# Patient Record
Sex: Female | Born: 2005 | Race: Black or African American | Hispanic: No | Smoking: Never smoker
Health system: Southern US, Community
[De-identification: ages and names within clinical notes are randomized; demographics above are authoritative.]

## PROBLEM LIST (undated history)

## (undated) ENCOUNTER — Inpatient Hospital Stay (HOSPITAL_COMMUNITY): Payer: Self-pay

## (undated) DIAGNOSIS — T783XXA Angioneurotic edema, initial encounter: Secondary | ICD-10-CM

## (undated) DIAGNOSIS — L509 Urticaria, unspecified: Secondary | ICD-10-CM

## (undated) HISTORY — DX: Urticaria, unspecified: L50.9

## (undated) HISTORY — DX: Angioneurotic edema, initial encounter: T78.3XXA

## (undated) HISTORY — PX: ORTHOPEDIC SURGERY: SHX850

---
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
## 2023-02-02 ENCOUNTER — Other Ambulatory Visit: Payer: Self-pay

## 2023-02-02 ENCOUNTER — Encounter (HOSPITAL_BASED_OUTPATIENT_CLINIC_OR_DEPARTMENT_OTHER): Payer: Self-pay | Admitting: Emergency Medicine

## 2023-02-02 ENCOUNTER — Emergency Department (HOSPITAL_BASED_OUTPATIENT_CLINIC_OR_DEPARTMENT_OTHER)
Admission: EM | Admit: 2023-02-02 | Discharge: 2023-02-02 | Disposition: A | Discharge status: Home / Self Care | Payer: Medicaid Other | Attending: Emergency Medicine | Admitting: Emergency Medicine

## 2023-02-02 ENCOUNTER — Emergency Department (HOSPITAL_BASED_OUTPATIENT_CLINIC_OR_DEPARTMENT_OTHER): Payer: Medicaid Other | Admitting: Radiology

## 2023-02-02 DIAGNOSIS — R079 Chest pain, unspecified: Secondary | ICD-10-CM | POA: Diagnosis present

## 2023-02-02 DIAGNOSIS — R0781 Pleurodynia: Secondary | ICD-10-CM | POA: Diagnosis not present

## 2023-02-02 LAB — BASIC METABOLIC PANEL
Anion gap: 9 (ref 5–15)
BUN: 11 mg/dL (ref 6–20)
CO2: 23 mmol/L (ref 22–32)
Calcium: 9.5 mg/dL (ref 8.9–10.3)
Chloride: 105 mmol/L (ref 98–111)
Creatinine, Ser: 0.84 mg/dL (ref 0.44–1.00)
GFR, Estimated: >60 mL/min (ref >60)
Glucose, Bld: 94 mg/dL (ref 70–99)
Potassium: 4 mmol/L (ref 3.5–5.1)
Sodium: 137 mmol/L (ref 135–145)

## 2023-02-02 LAB — CBC
HCT: 36.8 % (ref 36.0–46.0)
Hemoglobin: 12.3 g/dL (ref 12.0–15.0)
MCH: 30.8 pg (ref 26.0–34.0)
MCHC: 33.4 g/dL (ref 30.0–36.0)
MCV: 92.2 fL (ref 80.0–100.0)
Platelets: 365 10*3/uL (ref 150–400)
RBC: 3.99 MIL/uL (ref 3.87–5.11)
RDW: 13.6 % (ref 11.5–15.5)
WBC: 5 10*3/uL (ref 4.0–10.5)
nRBC: 0 % (ref 0.0–0.2)

## 2023-02-02 LAB — PREGNANCY, URINE: Preg Test, Ur: NEGATIVE

## 2023-02-02 LAB — D-DIMER, QUANTITATIVE: D-Dimer, Quant: <0.27 ug{FEU}/mL (ref 0.00–0.50)

## 2023-02-02 LAB — TROPONIN I (HIGH SENSITIVITY): Troponin I (High Sensitivity): 2 ng/L (ref <18)

## 2023-02-02 MED ORDER — LIDOCAINE 5 % EX PTCH
1.0000 | MEDICATED_PATCH | CUTANEOUS | Status: DC
  Administered 2023-02-02: 1 via TRANSDERMAL
  Filled 2023-02-02: qty 1

## 2023-02-02 MED ORDER — AZITHROMYCIN 250 MG PO TABS: 250.0000 mg | ORAL_TABLET | Freq: Every day | ORAL | 0 refills | Status: DC

## 2023-02-02 MED ORDER — ALBUTEROL SULFATE HFA 108 (90 BASE) MCG/ACT IN AERS
2.0000 | INHALATION_SPRAY | Freq: Once | RESPIRATORY_TRACT | Status: DC
  Filled 2023-02-02: qty 6.7

## 2023-02-02 NOTE — ED Provider Notes (Signed)
[place] EMERGENCY DEPARTMENT AT [facility] Provider Note   CSN: [id number] Arrival date & time: 02/02/23  1549     History  Chief Complaint  Patient presents with   Chest Pain    Natasha Hanna is a 17 y.o. female, no pertinent past medical history, on OCPs, who presents to the ED secondary to right pleuritic chest pain, it has been going on for the last couple months.  She states that in August, she went to a outside hospital, and was diagnosed with costochondritis, she was given ibuprofen and Toradol, with improvement of her symptoms for couple weeks.  Since then she has had intermittent right-sided chest pain, that got worse last night, that is pressurized in nature, worse when taking a deep breath.  She denies any swelling of her limbs, denies any kind of rashes.  States the area is just very tender especially when she touches it, or takes a deep breath.  She states her pain is a 7 out of 10.    Home Medications Prior to Admission medications   Medication Sig Start Date End Date Taking? Authorizing Provider  azithromycin (ZITHROMAX) 250 MG tablet Take 1 tablet (250 mg total) by mouth daily. Take first 2 tablets together, then 1 every day until finished. 02/02/23  Yes Micky Sheller L, PA  ibuprofen (ADVIL) 600 MG tablet Take 1 tablet (600 mg total) by mouth every 8 (eight) hours as needed for moderate pain. 08/29/19   Terrilee Files, MD      Allergies    Patient has no known allergies.    Review of Systems   Review of Systems  Respiratory:  Negative for shortness of breath.   Cardiovascular:  Positive for chest pain. Negative for leg swelling.    Physical Exam Updated Vital Signs BP (!) 143/96 (BP Location: Right Arm)   Pulse (!) 110   Temp 98.3 F (36.8 C) (Oral)   Resp 17   Ht 5' 7.5" (1.715 m)   Wt 67.6 kg   LMP 01/19/2023 (Exact Date)   SpO2 99%   BMI 22.99 kg/m  Physical Exam Vitals and nursing note reviewed.  Constitutional:      General:  She is not in acute distress.    Appearance: She is well-developed.  HENT:     Head: Normocephalic and atraumatic.  Eyes:     Conjunctiva/sclera: Conjunctivae normal.  Cardiovascular:     Rate and Rhythm: Normal rate and regular rhythm.     Heart sounds: No murmur heard. Pulmonary:     Effort: Pulmonary effort is normal. No respiratory distress.     Breath sounds: Normal breath sounds.  Chest:     Chest wall: Tenderness present.     Comments: Tenderness to palpation of right anterior chest wall, without crepitus, edema, warmth Abdominal:     Palpations: Abdomen is soft.     Tenderness: There is no abdominal tenderness.  Musculoskeletal:        General: No swelling.     Cervical back: Neck supple.  Skin:    General: Skin is warm and dry.     Capillary Refill: Capillary refill takes less than 2 seconds.  Neurological:     Mental Status: She is alert.  Psychiatric:        Mood and Affect: Mood normal.     ED Results / Procedures / Treatments   Labs (all labs ordered are listed, but only abnormal results are displayed) Labs Reviewed  BASIC METABOLIC PANEL  CBC  PREGNANCY, URINE  D-DIMER, QUANTITATIVE  TROPONIN I (HIGH SENSITIVITY)    EKG None  Radiology DG Chest 2 View  Result Date: 02/02/2023 CLINICAL DATA:  Chest pain EXAM: CHEST - 2 VIEW COMPARISON:  03/29/2011 FINDINGS: Midline trachea. Normal heart size and mediastinal contours. No pleural effusion or pneumothorax. Lower lung predominant interstitial thickening is mild to moderate. No lobar consolidation. EKG lead artifacts project over the upper lobes on the frontal. IMPRESSION: Lower lung predominant interstitial thickening is nonspecific. In this nonsmoker, considerations include asthma or viral/mycoplasma pneumonia. Electronically Signed   By: Jeronimo Greaves M.D.   On: 02/02/2023 18:38    Procedures Procedures    Medications Ordered in ED Medications  lidocaine (LIDODERM) 5 % 1 patch (1 patch Transdermal  Patch Applied 02/02/23 1709)    ED Course/ Medical Decision Making/ A&P                                 Medical Decision Making Patient is a 17 year old female, here for right-sided chest pain, and has been going on for last couple months, but has gotten worse in the past day.  She states it hurts when she takes a deep breath, and that it only went away for about 2 weeks while she was taking ibuprofen, but it came back.  She does have some chest wall tenderness, pain worse with a deep breath.  Given that she is on oral contraceptives, we will obtain a D-dimer, troponins, and chest x-ray for further evaluation.  She is overall well-appearing.  Amount and/or Complexity of Data Reviewed Labs: ordered.    Details: Troponin within normal limits, D-dimer less than 0.27 Radiology: ordered.    Details: Chest x-ray shows possible asthma findings, versus a viral or mycoplasma pneumonia Discussion of management or test interpretation with external provider(s): Given recurrent symptoms, for follow-up, we will go ahead and treat for possible mycoplasma pneumonia given the pleuritic pain, I will prescribe her some Z-Pak, have her follow-up with a PCP.  She does not have a PCP, however she was instructed to follow-up with the [facility] health center, or to use the number on the back of the discharge, to make an appointment with the primary care doctor.  She was advised on return precautions she voiced understanding.  I also emphasized the importance of following up with PCP in regards to possibly getting started on a maintenance inhaler.  Risk Prescription drug management.   Final Clinical Impression(s) / ED Diagnoses Final diagnoses:  Pleurodynia    Rx / DC Orders ED Discharge Orders          Ordered    azithromycin (ZITHROMAX) 250 MG tablet  Daily        02/02/23 1901              Pete Pelt, PA 02/02/23 1906    Benjiman Core, MD 02/02/23 2237
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
## 2023-02-02 NOTE — Discharge Instructions (Addendum)
Your chest x-ray was concerning for possible pneumonia, versus uncontrolled asthma.  We will go ahead and treat you for possible pneumonia with azithromycin, however I need you to follow-up with your primary care doctor, for better control of your asthma.  You will likely need a maintenance inhaler, as well as your rescue inhaler.  Return to the ER if you develop worsening chest pain, shortness of breath, or fevers or chills

## 2024-02-14 ENCOUNTER — Other Ambulatory Visit: Payer: Self-pay

## 2024-02-14 ENCOUNTER — Ambulatory Visit
Admission: EM | Admit: 2024-02-14 | Discharge: 2024-02-14 | Disposition: A | Discharge status: Home / Self Care | Attending: Internal Medicine | Admitting: Internal Medicine

## 2024-02-14 ENCOUNTER — Encounter (HOSPITAL_COMMUNITY): Payer: Self-pay | Admitting: Emergency Medicine

## 2024-02-14 ENCOUNTER — Emergency Department (HOSPITAL_COMMUNITY): Admission: EM | Admit: 2024-02-14 | Discharge: 2024-02-15 | Disposition: A

## 2024-02-14 ENCOUNTER — Encounter: Payer: Self-pay | Admitting: Emergency Medicine

## 2024-02-14 DIAGNOSIS — R221 Localized swelling, mass and lump, neck: Secondary | ICD-10-CM | POA: Diagnosis present

## 2024-02-14 DIAGNOSIS — R300 Dysuria: Secondary | ICD-10-CM

## 2024-02-14 DIAGNOSIS — R1024 Suprapubic pain: Secondary | ICD-10-CM

## 2024-02-14 DIAGNOSIS — T7819XA Other adverse food reactions, not elsewhere classified, initial encounter: Secondary | ICD-10-CM | POA: Insufficient documentation

## 2024-02-14 DIAGNOSIS — N3000 Acute cystitis without hematuria: Secondary | ICD-10-CM | POA: Diagnosis not present

## 2024-02-14 DIAGNOSIS — T7840XA Allergy, unspecified, initial encounter: Secondary | ICD-10-CM

## 2024-02-14 LAB — POCT URINE DIPSTICK
Bilirubin, UA: NEGATIVE
Glucose, UA: NEGATIVE mg/dL
Ketones, POC UA: NEGATIVE mg/dL
Nitrite, UA: NEGATIVE
POC PROTEIN,UA: NEGATIVE
Spec Grav, UA: 1.015 (ref 1.010–1.025)
Urobilinogen, UA: 1 U/dL
pH, UA: 7.5 (ref 5.0–8.0)

## 2024-02-14 MED ORDER — SULFAMETHOXAZOLE-TRIMETHOPRIM 800-160 MG PO TABS: 1.0000 | ORAL_TABLET | Freq: Two times a day (BID) | ORAL | 0 refills | Status: DC

## 2024-02-14 MED ORDER — CEPHALEXIN 500 MG PO CAPS: 500.0000 mg | ORAL_CAPSULE | Freq: Four times a day (QID) | ORAL | 0 refills | Status: DC

## 2024-02-14 MED ORDER — EPINEPHRINE 0.3 MG/0.3ML IJ SOAJ
INTRAMUSCULAR | Status: AC
  Administered 2024-02-14: 0.3 mg via INTRAMUSCULAR
  Filled 2024-02-14: qty 0.3

## 2024-02-14 MED ORDER — DIPHENHYDRAMINE HCL 50 MG/ML IJ SOLN
50.0000 mg | Freq: Once | INTRAMUSCULAR | Status: AC
  Administered 2024-02-14: 50 mg via INTRAVENOUS
  Filled 2024-02-14: qty 1

## 2024-02-14 MED ORDER — METHYLPREDNISOLONE SODIUM SUCC 125 MG IJ SOLR
125.0000 mg | Freq: Once | INTRAMUSCULAR | Status: AC
  Administered 2024-02-14: 125 mg via INTRAVENOUS
  Filled 2024-02-14: qty 2

## 2024-02-14 MED ORDER — FAMOTIDINE IN NACL 20-0.9 MG/50ML-% IV SOLN
20.0000 mg | Freq: Once | INTRAVENOUS | Status: AC
  Administered 2024-02-14: 20 mg via INTRAVENOUS
  Filled 2024-02-14: qty 50

## 2024-02-14 MED ORDER — EPINEPHRINE 0.3 MG/0.3ML IJ SOAJ: 0.3000 mg | Freq: Once | INTRAMUSCULAR | Status: AC

## 2024-02-14 NOTE — Discharge Instructions (Signed)
 Urinalysis done today is consistent with a urinary tract infection along with the symptoms.  Given the recent urinary tract infection we will send the urine off for culture to check for sensitivity of antibiotics.  We will use Bactrim for the antibiotic given the recent use of Macrobid.  We will treat with the following: Sulfamethoxazole-trimethoprim (Bactrim DS) 800/160 mg twice daily for 5 days.  This is an antibiotic.  Take this with food.  Make sure to stay hydrated by drinking plenty of water. Return to urgent care or PCP if symptoms worsen or fail to resolve.

## 2024-02-14 NOTE — Discharge Instructions (Addendum)
 Stop taking the Septra.  We are prescribing you a different antibiotic for your urinary tract infection.  Please keep your EpiPen  with you and use it if you feel allergic symptoms again with tongue swelling, lip swelling, difficulty swallowing, difficulty breathing, diffuse hives.  Please follow-up with your primary doctor.

## 2024-02-14 NOTE — ED Triage Notes (Signed)
 Pt presents c/o UTI sxs x 3 days. Pt states,  I think I have a UTI. I had my first UTI 2 weeks ago and these sxs feel similar. I have to pee all the time. It hurts when you push down on my bladder.

## 2024-02-14 NOTE — ED Provider Triage Note (Signed)
 Emergency Medicine Provider Triage Evaluation Note  Natasha Hanna , a 18 y.o. female  was evaluated in triage.  Pt complains of allergic reaction. Report eating pasta this afternoon and immediately noticing swelling to lip, throat irritation, itchiness to skin.  Has hx of asthma and report some mild sob. No abd cramping.  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  No other environmental changes  Review of Systems  Positive: As above Negative: As above  Physical Exam  LMP 01/31/2024 (Exact Date)  Gen:   Awake, no distress   Resp:  Normal effort  MSK:   Moves extremities without difficulty  Other:  Lip mildly edematous, normal tongue, normal uvula.  Hoarse voice.  Lung exam with faint wheezes  Medical Decision Making  Medically screening exam initiated at 9:35 PM.  Appropriate orders placed.  Natasha Hanna was informed that the remainder of the evaluation will be completed by another provider, this initial triage assessment does not replace that evaluation, and the importance of remaining in the ED until their evaluation is complete.  Concerns of anaphylaxis reaction.  Will give epi, solumedrol, benadryl, pepcid.  Will place in a room for close monitoring.    Natasha Colon, PA-C 02/14/24 2137

## 2024-02-14 NOTE — ED Triage Notes (Signed)
 Patient endorses allergic reaction to potentially shellfish.  Patient endorses eating pasta tonight, started having facial swelling along with throat swelling approx 30 mins ago.  Patient clearing throat, visible lip and facial swelling noted.

## 2024-02-14 NOTE — ED Provider Notes (Signed)
 [place] [facility] AT [facility] Provider Note   CSN: [id number] Arrival date & time: 02/14/24  2130     Patient presents with: Allergic Reaction   Natasha Hanna is a 18 y.o. female.   This is a 18 year old female presenting emergency department for allergic reaction.  She ate some pasta and then had some swelling to lip and throat, itchiness to her back.  See ED course for further HPI   Allergic Reaction      Prior to Admission medications   Medication Sig Start Date End Date Taking? Authorizing Provider  cephALEXin (KEFLEX) 500 MG capsule Take 1 capsule (500 mg total) by mouth 4 (four) times daily. 02/14/24  Yes Neysa Caron PARAS, DO  EPINEPHrine  0.3 mg/0.3 mL IJ SOAJ injection SMARTSIG:1 pre-filled pen syringe IM Once 02/05/24   [provider]  ibuprofen  (ADVIL ) 600 MG tablet Take 1 tablet (600 mg total) by mouth every 8 (eight) hours as needed for moderate pain. 08/29/19   Butler, Michael C, MD  mometasone (NASONEX) 50 MCG/ACT nasal spray Place 2 sprays into the nose. 09/13/18   [provider]    Allergies: Septra [sulfamethoxazole-trimethoprim] and Shellfish allergy    Review of Systems  Updated Vital Signs BP 115/61   Pulse 80   Resp 20   Wt 71 kg   LMP 01/31/2024 (Exact Date)   SpO2 100%   BMI 24.15 kg/m   Physical Exam Vitals and nursing note reviewed.  HENT:     Head: Normocephalic and atraumatic.     Nose: Nose normal.     Mouth/Throat:     Mouth: Mucous membranes are dry.  Eyes:     Conjunctiva/sclera: Conjunctivae normal.  Cardiovascular:     Rate and Rhythm: Normal rate and regular rhythm.  Pulmonary:     Effort: Pulmonary effort is normal.     Breath sounds: Normal breath sounds.  Abdominal:     General: Abdomen is flat. There is no distension.     Tenderness: There is no abdominal tenderness. There is no guarding or rebound.  Musculoskeletal:        General: Normal range of motion.  Skin:     General: Skin is warm and dry.     Capillary Refill: Capillary refill takes less than 2 seconds.     Findings: No rash.  Neurological:     Mental Status: She is alert and oriented to person, place, and time.  Psychiatric:        Mood and Affect: Mood normal.        Behavior: Behavior normal.     (all labs ordered are listed, but only abnormal results are displayed) Labs Reviewed - No data to display  EKG: None  Radiology: No results found.   .Critical Care  Performed by: Neysa Caron PARAS, DO Authorized by: Neysa Caron PARAS, DO   Critical care provider statement:    Critical care time (minutes):  30   Critical care time was exclusive of:  Separately billable procedures and treating other patients   Critical care was necessary to treat or prevent imminent or life-threatening deterioration of the following conditions:  Shock and circulatory failure   Critical care was time spent personally by me on the following activities:  Evaluation of patient's response to treatment, examination of patient, pulse oximetry, re-evaluation of patient's condition and review of old charts    Medications Ordered in the ED  EPINEPHrine  (EPI-PEN) injection 0.3 mg (0.3 mg Intramuscular Given 02/14/24  2140)  methylPREDNISolone sodium succinate (SOLU-MEDROL) 125 mg/2 mL injection 125 mg (125 mg Intravenous Given 02/14/24 2202)  famotidine (PEPCID) IVPB 20 mg premix (0 mg Intravenous Stopped 02/14/24 2355)  diphenhydrAMINE (BENADRYL) injection 50 mg (50 mg Intravenous Given 02/14/24 2200)    Clinical Course as of 02/14/24 2359  Mon Feb 14, 2024  2144 Patient was given epi prior to my arrival for reported feeling of throat closing after eating pasta.  Has a shellfish allergy, but no other allergy.  Posta was made at home with no shellfish.  Triage notes patient with visible lip and facial swelling.  Do not appreciate angioedema on my exam, lungs are clear.  No rash.  Patient reports already feeling some  improvement of symptoms.  Will plan to give further allergy medications and observe. [TY]  2237 Patient reevaluated.  Continues to do well.  On reevaluation she provided further history that she actually took a Septra tablet tonight for a urinary tract infection along with her dinner.  This may be the cause for her allergic reaction. [TY]  2357 Continues to do well with resolution of symptoms.  Her symptoms started around 8 PM this evening.  Patient feeling improved and wanting to go home.  Feel this is reasonable given her overall clinical picture and minimal symptoms on arrival.  She does have an EpiPen  at home.  She states she will use it if her symptoms return and will come back to the emergency department.  Will discharge in stable condition. [TY]    Clinical Course User Index [TY] Neysa Caron PARAS, DO                                 Medical Decision Making This is a 18 year old female presenting emergency department for allergic reaction/anaphylactic reaction.  Given epi, Solu-Medrol, Benadryl, Pepcid.  Suspect Septra as cause as she was recently started on it for UTI and took it this evening with her Pasta.  Counseled patient to stop taking that medication.  Medication added to her allergy list.  Observed in the emergency department.  Will start alternative antibiotic for UTI as she did have evidence at urgent care earlier today per my chart review.  See ED course for further MDM and disposition  Risk Prescription drug management.       Final diagnoses:  Allergic reaction, initial encounter    ED Discharge Orders          Ordered    cephALEXin (KEFLEX) 500 MG capsule  4 times daily        02/14/24 2353               Neysa Caron PARAS, DO 02/14/24 2359

## 2024-02-14 NOTE — ED Provider Notes (Signed)
 [name]    CSN: [id number] Arrival date & time: 02/14/24  1907      History   Chief Complaint No chief complaint on file.   HPI Natasha Hanna is a 18 y.o. female.   18 year old female who presents urgent care with complaints of suprapubic pain, urinary frequency, urinary urgency and dysuria.  She reports her symptoms started about 3 days ago.  She was just treated about 2 weeks ago for urinary tract infection with Macrobid and had similar symptoms.  She did have resolution of the symptoms prior to these occurring.  She denies any nausea, vomiting, fevers, chills, vaginal symptoms.  She does sometimes hold her urine for long periods of time at work.  She tries to drink plenty of water but does drink Gatorade along with it.     Past Medical History:  Diagnosis Date   Asthma     There are no active problems to display for this patient.   Past Surgical History:  Procedure Laterality Date   ORTHOPEDIC SURGERY      OB History   No obstetric history on file.    Obstetric Comments  nexiplan implant          Home Medications    Prior to Admission medications   Medication Sig Start Date End Date Taking? Authorizing Provider  EPINEPHrine  0.3 mg/0.3 mL IJ SOAJ injection SMARTSIG:1 pre-filled pen syringe IM Once 02/05/24  Yes [provider]  mometasone (NASONEX) 50 MCG/ACT nasal spray Place 2 sprays into the nose. 09/13/18  Yes [provider]  sulfamethoxazole-trimethoprim (BACTRIM DS) 800-160 MG tablet Take 1 tablet by mouth 2 (two) times daily for 5 days. 02/14/24 02/19/24 Yes Kennard Fildes A, PA-C  ibuprofen  (ADVIL ) 600 MG tablet Take 1 tablet (600 mg total) by mouth every 8 (eight) hours as needed for moderate pain. 08/29/19   Towana Ozell BROCKS, MD    Family History History reviewed. No pertinent family history.  Social History Social History   Tobacco Use   Smoking status: Never    Passive exposure: Current   Smokeless  tobacco: Never  Vaping Use   Vaping status: Never Used  Substance Use Topics   Alcohol use: No   Drug use: No     Allergies   Patient has no known allergies.   Review of Systems Review of Systems  Constitutional:  Negative for chills and fever.  HENT:  Negative for ear pain and sore throat.   Eyes:  Negative for pain and visual disturbance.  Respiratory:  Negative for cough and shortness of breath.   Cardiovascular:  Negative for chest pain and palpitations.  Gastrointestinal:  Positive for abdominal pain. Negative for vomiting.  Genitourinary:  Positive for dysuria, frequency and urgency. Negative for hematuria.  Musculoskeletal:  Negative for arthralgias and back pain.  Skin:  Negative for color change and rash.  Neurological:  Negative for seizures and syncope.  All other systems reviewed and are negative.    Physical Exam Triage Vital Signs ED Triage Vitals  Encounter Vitals Group     BP 02/14/24 1927 132/79     Girls Systolic BP Percentile --      Girls Diastolic BP Percentile --      Boys Systolic BP Percentile --      Boys Diastolic BP Percentile --      Pulse Rate 02/14/24 1927 78     Resp 02/14/24 1927 16     Temp 02/14/24 1927 97.8 F (36.6 C)  Temp Source 02/14/24 1927 Oral     SpO2 02/14/24 1927 100 %     Weight 02/14/24 1926 156 lb (70.8 kg)     Height --      Head Circumference --      Peak Flow --      Pain Score 02/14/24 1925 8     Pain Loc --      Pain Education --      Exclude from Growth Chart --    No data found.  Updated Vital Signs BP 132/79 (BP Location: Right Arm)   Pulse 78   Temp 97.8 F (36.6 C) (Oral)   Resp 16   Wt 156 lb (70.8 kg)   LMP 01/31/2024 (Exact Date)   SpO2 100%   BMI 24.07 kg/m   Visual Acuity Right Eye Distance:   Left Eye Distance:   Bilateral Distance:    Right Eye Near:   Left Eye Near:    Bilateral Near:     Physical Exam Vitals and nursing note reviewed.  Constitutional:      General: She  is not in acute distress.    Appearance: She is well-developed.  HENT:     Head: Normocephalic and atraumatic.  Eyes:     Conjunctiva/sclera: Conjunctivae normal.  Cardiovascular:     Rate and Rhythm: Normal rate and regular rhythm.     Heart sounds: No murmur heard. Pulmonary:     Effort: Pulmonary effort is normal. No respiratory distress.     Breath sounds: Normal breath sounds.  Abdominal:     Palpations: Abdomen is soft.     Tenderness: There is abdominal tenderness in the suprapubic area. There is no right CVA tenderness or left CVA tenderness.  Musculoskeletal:        General: No swelling.     Cervical back: Neck supple.  Skin:    General: Skin is warm and dry.     Capillary Refill: Capillary refill takes less than 2 seconds.  Neurological:     Mental Status: She is alert.  Psychiatric:        Mood and Affect: Mood normal.      UC Treatments / Results  Labs (all labs ordered are listed, but only abnormal results are displayed) Labs Reviewed  POCT URINE DIPSTICK - Abnormal; Notable for the following components:      Result Value   Clarity, UA cloudy (*)    Blood, UA trace-intact (*)    Leukocytes, UA Large (3+) (*)    All other components within normal limits  URINE CULTURE    EKG   Radiology No results found.  Procedures Procedures (including critical care time)  Medications Ordered in UC Medications - No data to display  Initial Impression / Assessment and Plan / UC Course  I have reviewed the triage vital signs and the nursing notes.  Pertinent labs & imaging results that were available during my care of the patient were reviewed by me and considered in my medical decision making (see chart for details).     Acute cystitis without hematuria  Dysuria - Plan: Urine Culture, Urine Culture  Suprapubic pain   Urinalysis done today is consistent with a urinary tract infection along with the symptoms.  Given the recent urinary tract infection we  will send the urine off for culture to check for sensitivity of antibiotics.  We will use Bactrim for the antibiotic given the recent use of Macrobid.  We will treat with the following:  Sulfamethoxazole-trimethoprim (Bactrim DS) 800/160 mg twice daily for 5 days.  This is an antibiotic.  Take this with food.  Make sure to stay hydrated by drinking plenty of water. Return to urgent care or PCP if symptoms worsen or fail to resolve.    Final Clinical Impressions(s) / UC Diagnoses   Final diagnoses:  Dysuria  Acute cystitis without hematuria  Suprapubic pain     Discharge Instructions      Urinalysis done today is consistent with a urinary tract infection along with the symptoms.  Given the recent urinary tract infection we will send the urine off for culture to check for sensitivity of antibiotics.  We will use Bactrim for the antibiotic given the recent use of Macrobid.  We will treat with the following: Sulfamethoxazole-trimethoprim (Bactrim DS) 800/160 mg twice daily for 5 days.  This is an antibiotic.  Take this with food.  Make sure to stay hydrated by drinking plenty of water. Return to urgent care or PCP if symptoms worsen or fail to resolve.      ED Prescriptions     Medication Sig Dispense Auth. Provider   sulfamethoxazole-trimethoprim (BACTRIM DS) 800-160 MG tablet Take 1 tablet by mouth 2 (two) times daily for 5 days. 10 tablet Teresa Almarie LABOR, [place]      PDMP not reviewed this encounter.   Teresa Almarie LABOR, [place] 02/14/24 1945

## 2024-02-15 ENCOUNTER — Ambulatory Visit (INDEPENDENT_AMBULATORY_CARE_PROVIDER_SITE_OTHER): Admitting: Family Medicine

## 2024-02-15 ENCOUNTER — Encounter: Payer: Self-pay | Admitting: Family Medicine

## 2024-02-15 VITALS — BP 146/85 | HR 103 | Ht 67.5 in | Wt 148.8 lb

## 2024-02-15 DIAGNOSIS — Z889 Allergy status to unspecified drugs, medicaments and biological substances status: Secondary | ICD-10-CM

## 2024-02-15 DIAGNOSIS — N390 Urinary tract infection, site not specified: Secondary | ICD-10-CM | POA: Diagnosis not present

## 2024-02-15 DIAGNOSIS — Z7689 Persons encountering health services in other specified circumstances: Secondary | ICD-10-CM

## 2024-02-15 LAB — URINE CULTURE: Culture: NO GROWTH

## 2024-02-16 ENCOUNTER — Encounter: Payer: Self-pay | Admitting: Family Medicine

## 2024-02-16 ENCOUNTER — Ambulatory Visit (HOSPITAL_COMMUNITY): Payer: Self-pay

## 2024-02-16 NOTE — Progress Notes (Signed)
 New Patient Office Visit  Subjective    Patient ID: Natasha Hanna, female    DOB: 10-15-2005  Age: 18 y.o. MRN: [id number]  CC:  Chief Complaint  Patient presents with   Establish Care    Check up  Pt would like a referral to see allergist     HPI [facility] presents to establish care and desiring a referral to allergist. Patient reports that she has had allergic reactions (anaphylactic) recently to shrimp and sulfa drugs. She now is carrying an epi pen. Patient also reports that she is having recurrent UTI. She is  sexually active. Denies vaginal sx.    Outpatient Encounter Medications as of 02/15/2024  Medication Sig   cephALEXin (KEFLEX) 500 MG capsule Take 1 capsule (500 mg total) by mouth 4 (four) times daily.   EPINEPHrine  0.3 mg/0.3 mL IJ SOAJ injection SMARTSIG:1 pre-filled pen syringe IM Once   ibuprofen  (ADVIL ) 600 MG tablet Take 1 tablet (600 mg total) by mouth every 8 (eight) hours as needed for moderate pain.   mometasone (NASONEX) 50 MCG/ACT nasal spray Place 2 sprays into the nose.   No facility-administered encounter medications on file as of 02/15/2024.    Past Medical History:  Diagnosis Date   Asthma     Past Surgical History:  Procedure Laterality Date   ORTHOPEDIC SURGERY      History reviewed. No pertinent family history.  Social History   Socioeconomic History   Marital status: Single    Spouse name: Not on file   Number of children: Not on file   Years of education: Not on file   Highest education level: Not on file  Occupational History   Not on file  Tobacco Use   Smoking status: Never    Passive exposure: Current   Smokeless tobacco: Never  Vaping Use   Vaping status: Never Used  Substance and Sexual Activity   Alcohol use: No   Drug use: No   Sexual activity: Not on file  Other Topics Concern   Not on file  Social History Narrative   Not on file   Social Drivers of Health   Financial Resource Strain: Low Risk   (02/15/2024)   Overall Financial Resource Strain (CARDIA)    Difficulty of Paying Living Expenses: Not very hard  Food Insecurity: Not on file  Transportation Needs: Not on file  Physical Activity: Sufficiently Active (02/15/2024)   Exercise Vital Sign    Days of Exercise per Week: 7 days    Minutes of Exercise per Session: 30 min  Stress: No Stress Concern Present (02/15/2024)   [employer] of Occupational Health - Occupational Stress Questionnaire    Feeling of Stress: Only a little  Social Connections: Moderately Isolated (02/15/2024)   Social Connection and Isolation Panel    Frequency of Communication with Friends and Family: More than three times a week    Frequency of Social Gatherings with Friends and Family: More than three times a week    Attends Religious Services: More than 4 times per year    Active Member of [employer] or Organizations: No    Attends Banker Meetings: Never    Marital Status: Never married  Intimate Partner Violence: Not on file    Review of Systems  All other systems reviewed and are negative.       Objective   BP (!) 146/85   Pulse (!) 103   Ht 5' 7.5 (1.715 m)   Wt 148 lb  12.8 oz (67.5 kg)   LMP 01/31/2024 (Exact Date)   SpO2 98%   BMI 22.96 kg/m   Physical Exam Vitals and nursing note reviewed.  Constitutional:      General: She is not in acute distress. Cardiovascular:     Rate and Rhythm: Normal rate and regular rhythm.  Pulmonary:     Effort: Pulmonary effort is normal.     Breath sounds: Normal breath sounds.  Abdominal:     Palpations: Abdomen is soft.     Tenderness: There is no abdominal tenderness.  Neurological:     General: No focal deficit present.     Mental Status: She is alert and oriented to person, place, and time.         Assessment & Plan:   1. History of severe allergic reaction (Primary)  - Ambulatory referral to Allergy  2. Recurrent UTI Most likely 2/2 sexual activity.  Discussed with patient preventative measures.   3. Encounter to establish care      Return in about 6 months (around 08/15/2024).   Tanda Raguel SQUIBB, MD

## 2024-03-22 ENCOUNTER — Encounter: Payer: Self-pay | Admitting: Internal Medicine

## 2024-03-22 ENCOUNTER — Other Ambulatory Visit: Payer: Self-pay

## 2024-03-22 ENCOUNTER — Ambulatory Visit: Admitting: Internal Medicine

## 2024-03-22 VITALS — BP 122/76 | HR 90 | Temp 98.3°F | Ht 66.0 in | Wt 151.1 lb

## 2024-03-22 DIAGNOSIS — L500 Allergic urticaria: Secondary | ICD-10-CM

## 2024-03-22 DIAGNOSIS — Z882 Allergy status to sulfonamides status: Secondary | ICD-10-CM | POA: Diagnosis not present

## 2024-03-22 DIAGNOSIS — J3089 Other allergic rhinitis: Secondary | ICD-10-CM | POA: Diagnosis not present

## 2024-03-22 NOTE — Patient Instructions (Addendum)
 Other Allergic Rhinitis: - Use Zyrtec 10mg  daily as needed for runny nose, sneezing, itchy watery eyes   Allergic Reaction - please strictly avoid seafood and mammalian meat for now.  - for SKIN only reaction, okay to take Zyrtec 10 mg every 12 hours as needed - for SKIN + ANY additional symptoms, OR IF concern for LIFE THREATENING reaction = Epipen  Autoinjector EpiPen  0.3 mg. - If using Epinephrine  autoinjector, call 911 or go to the ER.   Drug Reaction - For now continue avoidance of Bactrim . If this antibiotic was needed in the future, we can consider a direct oral challenge.   Hold all anti-histamines (Xyzal, Allegra, Zyrtec, Claritin, Benadryl , Pepcid ) 3 days prior to next visit.   Follow up: 12/9 at 1045 for skin testing 1-68, shellfish individuals, fish individuals

## 2024-03-22 NOTE — Progress Notes (Signed)
 NEW PATIENT  Date of Service/Encounter:  03/22/24  Consult requested by: Tanda Bleacher, MD   Subjective:   Natasha Hanna (DOB: [birth year]) is a 18 y.o. female who presents to the clinic on 03/22/2024 with a chief complaint of Allergic Reaction (Shellfish and sulfa  drugs in 1 month) .    History obtained from: chart review and patient.   Reactions: First was Oct 17th at birthday party, ate seafood (mostly shellfish) and developed facial itching, facial swelling, hives and throat closing about 30 minutes after.  Went to ED and treated for anaphylaxis with Epi and symptoms resolved; observed for 2 hours and d/c home.  Went to [facility] on Oct 27th for urinary frequency/urgency/dysuria; UA with leukocytes started on Bactrim . Later on Oct 27th, she took the Bactrim  and ate pasta (beef sausage, alfredo, noodles) and about 10 minutes later had lip swelling, hives and sensation of throat closing.  Went to the ED and treated for anaphylaxis with Epi and symptoms resolved.  Since the visit, has avoided all seafood but eaten wheat, eggs, milk without any issues.   No history of tick bites but she is an outdoor person.   Rhinitis:  Started since childhood.  Symptoms include: nasal congestion, rhinorrhea, and sneezing, headaches  Occurs seasonally-Fall/Winter  Potential triggers: not sure  Treatments tried:  Ibuprofen  PRN   Previous allergy testing: no History of sinus surgery: no Nonallergic triggers: none    Reviewed:  02/14/2024: seen in ED for allergic reaction with lip/throat swelling and itching after eating pasta.  Also had take Bactrim  for UTI.  Thought to be related to Bactrim  and was stopped.  Triage noted lip/facial swelling but MD did not and lungs were clear/no rashes.  Treated for anaphylaxis with Epi. Also given Solumedrol, Pepcid , Benadryl .   02/16/2024: seen by Dr Tanda for allergic reactions, shellfish and bactrim . Referred to Allergy.   02/05/2024: seen in ED for hives  and SOB after eating seafood, took benadryl .  Normal BP, Pulse, RR, SaO2 recorded but note mentions slightly elevated HR and BP.  Normal lung and heart exam. Hives on exam.  No signs of airway compromise.  Given Epi, Solumedrol, Pepcid  in ED.   Past Medical History: Past Medical History:  Diagnosis Date   Angio-edema    Asthma    Urticaria    Past Surgical History: Past Surgical History:  Procedure Laterality Date   ORTHOPEDIC SURGERY      Family History: Family History  Problem Relation Age of Onset   Healthy Mother    Hypertension Father     Social History:  Flooring in bedroom: carpet Pets: none Tobacco use/exposure: none  Job: ecologist  Medication List:  Allergies as of 03/22/2024       Reactions   Septra  [sulfamethoxazole -trimethoprim ] Anaphylaxis   Shellfish Allergy Anaphylaxis        Medication List        Accurate as of March 22, 2024  9:31 AM. If you have any questions, ask your nurse or doctor.          Aurovela FE 1/20 1-20 MG-MCG tablet Generic drug: norethindrone-ethinyl estradiol-FE Take 1 tablet by mouth daily.   cephALEXin  500 MG capsule Commonly known as: KEFLEX  Take 1 capsule (500 mg total) by mouth 4 (four) times daily.   EPINEPHrine  0.3 mg/0.3 mL Soaj injection Commonly known as: EPI-PEN SMARTSIG:1 pre-filled pen syringe IM Once   ibuprofen  600 MG tablet Commonly known as: ADVIL  Take 1 tablet (600 mg total) by mouth every  8 (eight) hours as needed for moderate pain.   mometasone 50 MCG/ACT nasal spray Commonly known as: NASONEX Place 2 sprays into the nose.         REVIEW OF SYSTEMS: Pertinent positives and negatives discussed in HPI.   Objective:   Physical Exam: BP 122/76 (BP Location: Right Arm, Patient Position: Sitting)   Pulse 90   Temp 98.3 F (36.8 C) (Temporal)   Ht 5' 6 (1.676 m)   Wt 151 lb 1.6 oz (68.5 kg)   SpO2 99%   BMI 24.39 kg/m  Body mass index is 24.39 kg/m. GEN: alert, well  developed HEENT: clear conjunctiva, nose with + mild inferior turbinate hypertrophy, pale nasal mucosa, +clear rhinorrhea, + cobblestoning HEART: regular rate and rhythm, no murmur LUNGS: clear to auscultation bilaterally, no coughing, unlabored respiration ABDOMEN: soft, non distended  SKIN: no rashes or lesions   Assessment:   1. Allergic urticaria   2. Other allergic rhinitis   3. 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   - Use Zyrtec 10mg  daily as needed for runny nose, sneezing, itchy watery eyes   Allergic Reaction - Unclear etiology, 2 reactions with hives/swelling/trouble breathing; will check for seafood allergy, alpha gal and tryptase level.  First reaction with seafood exposure and second reaction with beef + Bactrim .  - please strictly avoid seafood and mammalian meat for now.  - for SKIN only reaction, okay to take Zyrtec 10 mg every 12 hours as needed - for SKIN + ANY additional symptoms, OR IF concern for LIFE THREATENING reaction = Epipen  Autoinjector EpiPen  0.3 mg. - If using Epinephrine  autoinjector, call 911 or go to the ER.   Drug Reaction - For now continue avoidance of Bactrim . If this antibiotic was needed in the future, we can consider a direct oral challenge.   Hold all anti-histamines (Xyzal, Allegra, Zyrtec, Claritin, Benadryl , Pepcid ) 3 days prior to next visit.   Follow up: 12/9 at 1045 for skin testing 1-68, shellfish individuals, fish individuals   Arleta Blanch, MD [facility] 

## 2024-03-24 LAB — ALLERGEN PROFILE, SHELLFISH
Clam IgE: <0.1 kU/L
F023-IgE Crab: <0.1 kU/L
F080-IgE Lobster: <0.1 kU/L
F290-IgE Oyster: <0.1 kU/L
Scallop IgE: <0.1 kU/L
Shrimp IgE: 0.15 kU/L — AB

## 2024-03-24 LAB — ALLERGEN PROFILE, FOOD-FISH
Allergen Mackerel IgE: <0.1 kU/L
Allergen Salmon IgE: <0.1 kU/L
Allergen Trout IgE: <0.1 kU/L
Allergen Walley Pike IgE: <0.1 kU/L
Codfish IgE: <0.1 kU/L
Halibut IgE: <0.1 kU/L
Tuna: <0.1 kU/L

## 2024-03-24 LAB — TRYPTASE: Tryptase: 2 ug/L — ABNORMAL LOW (ref 2.2–13.2)

## 2024-03-24 LAB — ALPHA-GAL PANEL
Allergen Lamb IgE: <0.1 kU/L
Beef IgE: <0.1 kU/L
IgE (Immunoglobulin E), Serum: 107 [IU]/mL (ref 6–495)
O215-IgE Alpha-Gal: <0.1 kU/L
Pork IgE: <0.1 kU/L

## 2024-03-28 ENCOUNTER — Encounter: Payer: Self-pay | Admitting: Internal Medicine

## 2024-03-28 ENCOUNTER — Ambulatory Visit: Admitting: Internal Medicine

## 2024-03-28 DIAGNOSIS — L500 Allergic urticaria: Secondary | ICD-10-CM

## 2024-03-28 DIAGNOSIS — J3089 Other allergic rhinitis: Secondary | ICD-10-CM | POA: Diagnosis not present

## 2024-03-28 DIAGNOSIS — J301 Allergic rhinitis due to pollen: Secondary | ICD-10-CM | POA: Diagnosis not present

## 2024-03-28 DIAGNOSIS — J3081 Allergic rhinitis due to animal (cat) (dog) hair and dander: Secondary | ICD-10-CM

## 2024-03-28 MED ORDER — AZELASTINE HCL 0.1 % NA SOLN: 2.0000 | Freq: Two times a day (BID) | NASAL | 5 refills | Status: DC | PRN

## 2024-03-28 MED ORDER — FLUTICASONE PROPIONATE 50 MCG/ACT NA SUSP: 2.0000 | Freq: Every day | NASAL | 5 refills | Status: AC

## 2024-03-28 MED ORDER — CETIRIZINE HCL 10 MG PO TABS: 10.0000 mg | ORAL_TABLET | Freq: Every day | ORAL | 5 refills | Status: DC

## 2024-03-28 NOTE — Progress Notes (Signed)
 FOLLOW UP Date of Service/Encounter:  03/28/24   Subjective:  Natasha Hanna (DOB: October 13, 2005) is a 18 y.o. female who [facility] for follow up for skin testing.   History obtained from: chart review and patient.  Anti histamines held.   Past Medical History: Past Medical History:  Diagnosis Date   Angio-edema    Asthma    Urticaria     Objective:  There were no vitals taken for this visit. There is no height or weight on file to calculate BMI. Physical Exam: GEN: alert, well developed HEENT: clear conjunctiva, MMM LUNGS: unlabored respiration  Skin Testing:  Skin prick testing was placed, which includes aeroallergens/foods, histamine control, and saline control.  Verbal consent was obtained prior to placing test.  Patient tolerated procedure well.  Allergy  testing results were read and interpreted by myself, documented by clinical staff. Adequate positive and negative control.  Positive results to:  Results discussed with patient/family.  Airborne Adult Perc - 03/28/24 1044     Time Antigen Placed 1044    Allergen Manufacturer Jestine    Location Back    Number of Test 55    1. Control-Buffer 50% Glycerol Negative    2. Control-Histamine 3+    3. Bahia Negative    4. Bermuda Negative    5. Johnson 2+    6. [place]  Blue Negative    7. Meadow Fescue Negative    8. Perennial Rye 2+    9. Timothy 2+    10. Ragweed Mix Negative    11. Cocklebur Negative    12. Plantain,  English Negative    13. Baccharis Negative    14. Dog Fennel Negative    15. Russian Thistle Negative    16. Lamb's Quarters Negative    17. Sheep Sorrell Negative    18. Rough Pigweed 2+    19. Marsh Elder, Rough 2+    20. Mugwort, Common Negative    21. Box, Elder 2+    22. Cedar, red Negative    23. Sweet Gum 3+    24. Pecan Pollen 3+    25. Pine Mix 3+    26. Walnut, Black Pollen 2+    27. Red Mulberry Negative    28. Ash Mix 3+    29.  Birch Mix 3+    30. Beech American 3+    31. Cottonwood, Eastern 2+    32. Hickory, White 2+    33. Maple Mix Negative    34. Oak, Eastern Mix 3+    35. Sycamore Eastern Negative    36. Alternaria Alternata Negative    37. Cladosporium Herbarum Negative    38. Aspergillus Mix Negative    39. Penicillium Mix Negative    40. Bipolaris Sorokiniana (Helminthosporium) Negative    41. Drechslera Spicifera (Curvularia) Negative    42. Mucor Plumbeus Negative    43. Fusarium Moniliforme Negative    44. Aureobasidium Pullulans (pullulara) Negative    45. Rhizopus Oryzae Negative    46. Botrytis Cinera Negative    47. Epicoccum Nigrum Negative    48. Phoma Betae Negative    49. Dust Mite Mix 3+    50. Cat Hair 10,000 BAU/ml 3+    51.  Dog Epithelia Negative    52. Mixed Feathers Negative    53. Horse Epithelia Negative    54. Cockroach, German Negative    55. Tobacco Leaf Negative  Food Adult Perc - 03/28/24 1000     Time Antigen Placed 1044    Allergen Manufacturer Jestine    Location Back    Number of allergen test 23    1. Peanut Negative    2. Soybean Negative    3. Wheat Negative    4. Sesame Negative    5. Milk, Cow Negative    6. Casein Negative    7. Egg White, Chicken Negative    8. Shellfish Mix Negative    9. Fish Mix Negative    10. Cashew Negative    11. Walnut Food Negative    12. Almond Negative    13. Hazelnut Negative    18. Trout Negative    19. Tuna Negative    20. Salmon Negative    21. Flounder Negative    22. Codfish Negative    23. Shrimp Negative    24. Crab Negative    25. Lobster Negative    26. Oyster Negative    27. Scallops Negative           Assessment:   1. Seasonal allergic rhinitis due to pollen   2. Allergic rhinitis due to dust mite   3. Allergic rhinitis due to animal hair or dander   4. Allergic urticaria     Plan/Recommendations:  Allergic Rhinitis: - Due to turbinate hypertrophy, seasonal symptoms,  recurrent reactions and unresponsive to over the counter meds, will perform skin testing to identify aeroallergen triggers.    Positive skin test 03/2024: trees, grasses, weeds, dust mites, cats  - Avoidance measures discussed. - Use nasal saline rinses before nose sprays such as with Neilmed Sinus Rinse.  Use distilled water.   - Use Flonase  1-2 sprays each nostril daily. Aim upward and outward. - Use Azelastine  1-2 sprays each nostril twice daily as needed for runny nose, drainage, sneezing, congestion. Aim upward and outward. - Use Zyrtec  10mg  daily as needed for runny nose, sneezing, itchy watery eyes  - Consider allergy  shots as long term control of your symptoms by teaching your immune system to be more tolerant of your allergy  triggers  Allergic Reaction - Reintroduce mammalian meat and seafood.  - Unclear etiology, 2 reactions with hives/swelling/trouble breathing; will check for seafood allergy , alpha gal and tryptase level.  First reaction with seafood exposure and second reaction with beef + Bactrim .  - sIgE 03/2024: normal tryptase; negative shellfish, fish, alpha gal  - SPT 03/2024: negative for commonly allergenic foods + shellfish/fish individuals  - for SKIN only reaction, okay to take Zyrtec  10 mg every 12 hours as needed - for SKIN + ANY additional symptoms, OR IF concern for LIFE THREATENING reaction = Epipen  Autoinjector EpiPen  0.3 mg. - If using Epinephrine  autoinjector, call 911 or go to the ER.    Drug Reaction - For now continue avoidance of Bactrim . If this antibiotic was needed in the future, we can consider a direct oral challenge.     Return in about 6 weeks (around 05/09/2024).  Arleta Blanch, MD [facility] 

## 2024-03-28 NOTE — Patient Instructions (Addendum)
 Allergic Rhinitis:   Positive skin test 03/2024: trees, grasses, weeds, dust mites, cats  - Use nasal saline rinses before nose sprays such as with Neilmed Sinus Rinse.  Use distilled water.   - Use Flonase  2 sprays each nostril daily. Aim upward and outward. - Use Azelastine  1-2 sprays each nostril twice daily as needed for runny nose, drainage, sneezing, congestion. Aim upward and outward. - Use Zyrtec  10mg  daily. - Consider allergy  shots as long term control of your symptoms by teaching your immune system to be more tolerant of your allergy  triggers  Allergic Reaction - Reintroduce mammalian meat and seafood.  - sIgE 03/2024: normal tryptase; negative shellfish, fish, alpha gal  - for SKIN only reaction, okay to take Zyrtec  10 mg every 12 hours as needed - for SKIN + ANY additional symptoms, OR IF concern for LIFE THREATENING reaction = Epipen  Autoinjector EpiPen  0.3 mg. - If using Epinephrine  autoinjector, call 911 or go to the ER.    Drug Reaction - For now continue avoidance of Bactrim . If this antibiotic was needed in the future, we can consider a direct oral challenge.   ALLERGEN AVOIDANCE MEASURES   Dust Mites Use central air conditioning and heat; and change the filter monthly.  Pleated filters work better than mesh filters.  Electrostatic filters may also be used; wash the filter monthly.  Window air conditioners may be used, but do not clean the air as well as a central air conditioner.  Change or wash the filter monthly. Keep windows closed.  Do not use attic fans.   Encase the mattress, box springs and pillows with zippered, dust proof covers. Wash the bed linens in hot water weekly.   Remove carpet, especially from the bedroom. Remove stuffed animals, throw pillows, dust ruffles, heavy drapes and other items that collect dust from the bedroom. Do not use a humidifier.   Use wood, vinyl or leather furniture instead of cloth furniture in the bedroom. Keep the indoor  humidity at 30 - 40%.   Pollen Avoidance Pollen levels are highest during the mid-day and afternoon.  Consider this when planning outdoor activities. Avoid being outside when the grass is being mowed, or wear a mask if the pollen-allergic person must be the one to mow the grass. Keep the windows closed to keep pollen outside of the home. Use an air conditioner to filter the air. Take a shower, wash hair, and change clothing after working or playing outdoors during pollen season. Pet Dander- Cats  Keep the pet out of your bedroom and restrict it to only a few rooms. Be advised that keeping the pet in only one room will not limit the allergens to that room. Don't pet, hug or kiss the pet; if you do, wash your hands with soap and water. High-efficiency particulate air (HEPA) cleaners run continuously in a bedroom or living room can reduce allergen levels over time. Regular use of a high-efficiency vacuum cleaner or a central vacuum can reduce allergen levels. Giving your pet a bath at least once a week can reduce airborne allergen.

## 2024-04-11 ENCOUNTER — Emergency Department (HOSPITAL_COMMUNITY)

## 2024-04-11 ENCOUNTER — Emergency Department (HOSPITAL_COMMUNITY)
Admission: EM | Admit: 2024-04-11 | Discharge: 2024-04-11 | Disposition: A | Discharge status: Home / Self Care | Attending: Emergency Medicine | Admitting: Emergency Medicine

## 2024-04-11 ENCOUNTER — Ambulatory Visit: Payer: Self-pay

## 2024-04-11 ENCOUNTER — Other Ambulatory Visit: Payer: Self-pay

## 2024-04-11 ENCOUNTER — Encounter (HOSPITAL_COMMUNITY): Payer: Self-pay | Admitting: Emergency Medicine

## 2024-04-11 DIAGNOSIS — D219 Benign neoplasm of connective and other soft tissue, unspecified: Secondary | ICD-10-CM

## 2024-04-11 DIAGNOSIS — O209 Hemorrhage in early pregnancy, unspecified: Secondary | ICD-10-CM | POA: Insufficient documentation

## 2024-04-11 DIAGNOSIS — O208 Other hemorrhage in early pregnancy: Secondary | ICD-10-CM | POA: Diagnosis not present

## 2024-04-11 DIAGNOSIS — O3410 Maternal care for benign tumor of corpus uteri, unspecified trimester: Secondary | ICD-10-CM | POA: Diagnosis not present

## 2024-04-11 DIAGNOSIS — O4690 Antepartum hemorrhage, unspecified, unspecified trimester: Secondary | ICD-10-CM

## 2024-04-11 DIAGNOSIS — Z3A01 Less than 8 weeks gestation of pregnancy: Secondary | ICD-10-CM | POA: Insufficient documentation

## 2024-04-11 DIAGNOSIS — J45909 Unspecified asthma, uncomplicated: Secondary | ICD-10-CM | POA: Diagnosis not present

## 2024-04-11 LAB — COMPREHENSIVE METABOLIC PANEL WITH GFR
ALT: 15 U/L (ref 0–44)
AST: 18 U/L (ref 15–41)
Albumin: 4.4 g/dL (ref 3.5–5.0)
Alkaline Phosphatase: 56 U/L (ref 38–126)
Anion gap: 10 (ref 5–15)
BUN: 11 mg/dL (ref 6–20)
CO2: 21 mmol/L — ABNORMAL LOW (ref 22–32)
Calcium: 9.6 mg/dL (ref 8.9–10.3)
Chloride: 104 mmol/L (ref 98–111)
Creatinine, Ser: 0.56 mg/dL (ref 0.44–1.00)
GFR, Estimated: >60 mL/min
Glucose, Bld: 79 mg/dL (ref 70–99)
Potassium: 3.6 mmol/L (ref 3.5–5.1)
Sodium: 136 mmol/L (ref 135–145)
Total Bilirubin: 0.7 mg/dL (ref 0.0–1.2)
Total Protein: 8 g/dL (ref 6.5–8.1)

## 2024-04-11 LAB — URINALYSIS, ROUTINE W REFLEX MICROSCOPIC
Bacteria, UA: NONE SEEN
Bilirubin Urine: NEGATIVE
Glucose, UA: NEGATIVE mg/dL
Ketones, ur: NEGATIVE mg/dL
Nitrite: NEGATIVE
Protein, ur: NEGATIVE mg/dL
RBC / HPF: >50 RBC/hpf (ref 0–5)
Specific Gravity, Urine: 1.015 (ref 1.005–1.030)
pH: 6 (ref 5.0–8.0)

## 2024-04-11 LAB — CBC WITH DIFFERENTIAL/PLATELET
Abs Immature Granulocytes: 0.01 K/uL (ref 0.00–0.07)
Basophils Absolute: 0 K/uL (ref 0.0–0.1)
Basophils Relative: 1 %
Eosinophils Absolute: 0.1 K/uL (ref 0.0–0.5)
Eosinophils Relative: 2 %
HCT: 37.2 % (ref 36.0–46.0)
Hemoglobin: 12.1 g/dL (ref 12.0–15.0)
Immature Granulocytes: 0 %
Lymphocytes Relative: 36 %
Lymphs Abs: 2.3 K/uL (ref 0.7–4.0)
MCH: 30.3 pg (ref 26.0–34.0)
MCHC: 32.5 g/dL (ref 30.0–36.0)
MCV: 93 fL (ref 80.0–100.0)
Monocytes Absolute: 0.5 K/uL (ref 0.1–1.0)
Monocytes Relative: 8 %
Neutro Abs: 3.4 K/uL (ref 1.7–7.7)
Neutrophils Relative %: 53 %
Platelets: 380 K/uL (ref 150–400)
RBC: 4 MIL/uL (ref 3.87–5.11)
RDW: 15.3 % (ref 11.5–15.5)
WBC: 6.4 K/uL (ref 4.0–10.5)
nRBC: 0 % (ref 0.0–0.2)

## 2024-04-11 LAB — ABO/RH: ABO/RH(D): O POS

## 2024-04-11 LAB — HCG, QUANTITATIVE, PREGNANCY: hCG, Beta Chain, Quant, S: 7061 m[IU]/mL — ABNORMAL HIGH

## 2024-04-11 NOTE — Discharge Instructions (Addendum)
 It was a pleasure taking care of you today. As discussed, you will need a repeat ultrasound in 14 days. Please have your hcg repeated in 48 hours. I have included the number of the OBGYN. Call to schedule an appointment. Return to the ER for new or worsening symptoms.   Your ultrasound also showed a probable fibroid and mass in/around right ovary. Please follow-up with OBGYN for further evaluation.

## 2024-04-11 NOTE — ED Triage Notes (Signed)
 Pt reports she is [redacted] weeks pregnant. Reports she went to bathroom today & noticed bright pink spotting. Denies abd pain.

## 2024-04-11 NOTE — ED Provider Notes (Signed)
 " [place] [facility] AT [facility] Provider Note   CSN: [id number] Arrival date & time: 04/11/24  8279     Patient presents with: Vaginal Bleeding   Natasha Hanna is a 18 y.o. female with a past medical history significant for asthma who presents to the ED due to vaginal bleeding.  Patient currently [redacted] weeks gestation.  Last menstrual cycle 11/17. G1P0.  Notes she went to the bathroom today and noticed pink spotting.  Had bright red spotting while here in the ED. denies any abdominal or pelvic pain.  No urinary symptoms.  Denies fever and chills.  History obtained from patient and past medical records. No interpreter used during encounter.       Prior to Admission medications  Medication Sig Start Date End Date Taking? Authorizing Provider  AUROVELA FE 1/20 1-20 MG-MCG tablet Take 1 tablet by mouth daily. 01/24/24   [provider]  azelastine  (ASTELIN ) 0.1 % nasal spray Place 2 sprays into both nostrils 2 (two) times daily as needed. Use in each nostril as directed 03/28/24   Tobie Arleta SQUIBB, MD  cephALEXin  (KEFLEX ) 500 MG capsule Take 1 capsule (500 mg total) by mouth 4 (four) times daily. Patient not taking: Reported on 03/22/2024 02/14/24   Neysa Caron PARAS, DO  cetirizine  (ZYRTEC  ALLERGY ) 10 MG tablet Take 1 tablet (10 mg total) by mouth daily. 03/28/24   Tobie Arleta SQUIBB, MD  EPINEPHrine  0.3 mg/0.3 mL IJ SOAJ injection SMARTSIG:1 pre-filled pen syringe IM Once 02/05/24   [provider]  fluticasone  (FLONASE ) 50 MCG/ACT nasal spray Place 2 sprays into both nostrils daily. 03/28/24   Tobie Arleta SQUIBB, MD  ibuprofen  (ADVIL ) 600 MG tablet Take 1 tablet (600 mg total) by mouth every 8 (eight) hours as needed for moderate pain. 08/29/19   Butler, Michael C, MD  mometasone (NASONEX) 50 MCG/ACT nasal spray Place 2 sprays into the nose. Patient not taking: Reported on 03/22/2024 09/13/18   [provider]    Allergies: Septra   [sulfamethoxazole -trimethoprim ] and Shellfish allergy     Review of Systems  Gastrointestinal:  Negative for abdominal pain.  Genitourinary:  Positive for vaginal bleeding. Negative for dysuria and pelvic pain.    Updated Vital Signs BP (!) 146/86 (BP Location: Left Arm)   Pulse 88   Temp 98.4 F (36.9 C) (Oral)   Resp 16   LMP 03/06/2024 (Exact Date)   SpO2 100%   Physical Exam Vitals and nursing note reviewed.  Constitutional:      General: She is not in acute distress.    Appearance: She is not ill-appearing.  HENT:     Head: Normocephalic.  Eyes:     Pupils: Pupils are equal, round, and reactive to light.  Cardiovascular:     Rate and Rhythm: Normal rate and regular rhythm.     Pulses: Normal pulses.     Heart sounds: Normal heart sounds. No murmur heard.    No friction rub. No gallop.  Pulmonary:     Effort: Pulmonary effort is normal.     Breath sounds: Normal breath sounds.  Abdominal:     General: Abdomen is flat. There is no distension.     Palpations: Abdomen is soft.     Tenderness: There is no abdominal tenderness. There is no guarding or rebound.  Musculoskeletal:        General: Normal range of motion.     Cervical back: Neck supple.  Skin:    General: Skin is warm  and dry.  Neurological:     General: No focal deficit present.     Mental Status: She is alert.  Psychiatric:        Mood and Affect: Mood normal.        Behavior: Behavior normal.     (all labs ordered are listed, but only abnormal results are displayed) Labs Reviewed  COMPREHENSIVE METABOLIC PANEL WITH GFR - Abnormal; Notable for the following components:      Result Value   CO2 21 (*)    All other components within normal limits  HCG, QUANTITATIVE, PREGNANCY - Abnormal; Notable for the following components:   hCG, Beta Chain, Quant, S 7,061 (*)    All other components within normal limits  URINALYSIS, ROUTINE W REFLEX MICROSCOPIC - Abnormal; Notable for the following components:    Hgb urine dipstick LARGE (*)    Leukocytes,Ua TRACE (*)    All other components within normal limits  CBC WITH DIFFERENTIAL/PLATELET  ABO/RH    EKG: None  Radiology: US  OB Comp < 14 Wks Result Date: 04/11/2024 EXAM: OBSTETRIC ULTRASOUND FIRST TRIMESTER TECHNIQUE: Transabdominal and transvaginal first trimester obstetric pelvic duplex ultrasound was performed with real-time imaging, color flow Doppler imaging. COMPARISON: None available. CLINICAL HISTORY: vaginal bleeding FINDINGS: UTERUS: Focal thickening of the posterior wall of the uterus measuring 4.6 x 2.5 x 3.5 cm. This is favored to represent a fibroid. Focal adenomyosis could appear similarly. GESTATIONAL SAC(S): Single intrauterine gestational sac. Mean sac diameter 10.2 mm. Moderate subchorionic hemorrhage measuring 3.1 x 0.5 x 2.5 cm. The subchorionic hemorrhage encompasses 50% of the gestational sac. YOLK SAC: Present, measuring 2 mm. EMBRYO(<11WK) /FETUS(>=11WK): No embryo visualized. CROWN RUMP LENGTH: Not measured. RATE OF CARDIAC ACTIVITY: No cardiac activity or heart rate visualized. RIGHT OVARY: Normal right ovary. LEFT OVARY: There is a round mass in or near the left ovary measuring 1.8 x 1.8 x 2.0 cm and containing internal vascular flow. FREE FLUID: Small volume free fluid in the pelvic cul-de-sac. MEASUREMENTS ESTIMATED GESTATIONAL AGE BY CURRENT ULTRASOUND: 5 weeks 5 days IMPRESSION: 1. Probable early intrauterine gestational sac with yolk sac, but no fetal pole or cardiac activity yet visualized. Recommend follow-up quantitative B-HCG levels and follow-up US  in 14 days to assess viability. This recommendation follows SRU consensus guidelines: Diagnostic Criteria for Nonviable Pregnancy Early in the First Trimester. [name] Med 2013; 630:8556-48.Moderate subchorionic hemorrhage encompassing greater than 50% of the gestational sac. 2. Moderate subchorionic hemorrhage encompassing 50% of the gestational sac. Short interval follow  up recommended. 3. Focal thickening of the posterior wall of the uterus, favored to represent a fibroid. Focal adenomyosis could appear similarly. 4. Round mass in or near the right ovary measuring 1.8 x 1.8 x 2.0 cm with internal vascular flow. Continued attention on follow up. Electronically signed by: Norman Gatlin MD 04/11/2024 09:04 PM EST RP Workstation: [id number]   US  OB Transvaginal Result Date: 04/11/2024 EXAM: OBSTETRIC ULTRASOUND FIRST TRIMESTER TECHNIQUE: Transabdominal and transvaginal first trimester obstetric pelvic duplex ultrasound was performed with real-time imaging, color flow Doppler imaging. COMPARISON: None available. CLINICAL HISTORY: vaginal bleeding FINDINGS: UTERUS: Focal thickening of the posterior wall of the uterus measuring 4.6 x 2.5 x 3.5 cm. This is favored to represent a fibroid. Focal adenomyosis could appear similarly. GESTATIONAL SAC(S): Single intrauterine gestational sac. Mean sac diameter 10.2 mm. Moderate subchorionic hemorrhage measuring 3.1 x 0.5 x 2.5 cm. The subchorionic hemorrhage encompasses 50% of the gestational sac. YOLK SAC: Present, measuring 2 mm. EMBRYO(<11WK) /FETUS(>=11WK): No  embryo visualized. CROWN RUMP LENGTH: Not measured. RATE OF CARDIAC ACTIVITY: No cardiac activity or heart rate visualized. RIGHT OVARY: Normal right ovary. LEFT OVARY: There is a round mass in or near the left ovary measuring 1.8 x 1.8 x 2.0 cm and containing internal vascular flow. FREE FLUID: Small volume free fluid in the pelvic cul-de-sac. MEASUREMENTS ESTIMATED GESTATIONAL AGE BY CURRENT ULTRASOUND: 5 weeks 5 days IMPRESSION: 1. Probable early intrauterine gestational sac with yolk sac, but no fetal pole or cardiac activity yet visualized. Recommend follow-up quantitative B-HCG levels and follow-up US  in 14 days to assess viability. This recommendation follows SRU consensus guidelines: Diagnostic Criteria for Nonviable Pregnancy Early in the First Trimester. [name] Med 2013;  630:8556-48.Moderate subchorionic hemorrhage encompassing greater than 50% of the gestational sac. 2. Moderate subchorionic hemorrhage encompassing 50% of the gestational sac. Short interval follow up recommended. 3. Focal thickening of the posterior wall of the uterus, favored to represent a fibroid. Focal adenomyosis could appear similarly. 4. Round mass in or near the right ovary measuring 1.8 x 1.8 x 2.0 cm with internal vascular flow. Continued attention on follow up. Electronically signed by: Norman Gatlin MD 04/11/2024 09:04 PM EST RP Workstation: [id number]     Procedures   Medications Ordered in the ED - No data to display  Clinical Course as of 04/11/24 2129  Tue Apr 11, 2024  1928 Hemoglobin: 12.1 [CA]  1928 HCG, Beta Denis Earleen RAMAN(!): 7,061 [CA]  1928 ABO/RH(D): O POS Performed at [facility], [address] [place], [place] [address]  [CA]    Clinical Course User Index [CA] Lorelle Aleck BROCKS, PA-C                                 Medical Decision Making Amount and/or Complexity of Data Reviewed Labs: ordered. Decision-making details documented in ED Course. Radiology: ordered and independent interpretation performed. Decision-making details documented in ED Course.   This patient presents to the ED for concern of vaginal bleeding in pregnancy, this involves an extensive number of treatment options, and is a complaint that carries with it a high risk of complications and morbidity.  The differential diagnosis includes ectopic pregnancy, subchorionic hematoma, implantation bleeding, etc  18 year old female currently [redacted] weeks gestation presents to the ED due to vaginal bleeding. G1P0.  Denies abdominal/pelvic pain.  Upon arrival, stable vitals.  Patient well-appearing on exam.  Abdomen soft, nondistended, nontender.  Routine labs ordered.  Ultrasound rule out ectopic pregnancy.  CBC unremarkable.  No leukocytosis.  Normal hemoglobin.  CMP reassuring.   Normal renal function.  No major electrolyte derangements.  UA with trace leukocytes.  No bacteria.  Low suspicion for infection.  Large hematuria likely due to vaginal bleeding.  hCG 7000.  O+.  Ultrasound personally reviewed and interpreted which demonstrates probable early intrauterine gestational sac with yolk sac but no fetal pole or cardiac activity.  Recommend repeat hcg levels and repeat US  in 14 days.  Also demonstrates a moderate subchorionic hemorrhage, focal thickening of the wall of the uterus likely fibroid and a round mass in or near the right ovary.  Patient made aware of incidental findings and need to follow-up.  OB/GYN referral given to patient at discharge and advised to call to schedule an appointment for further evaluation. Patient will need repeat hormone level in 48 hours. Patient stable for discharge. Strict ED precautions discussed with patient. Patient states understanding  and agrees to plan. Patient discharged home in no acute distress and stable vitals  Co morbidities that complicate the patient evaluation  asthma   Social Determinants of Health:  Has PCP  Test / Admission - Considered:  Considered admission; however lower suspicion for ectopic pregnancy given US  results      Final diagnoses:  Vaginal bleeding in pregnancy  Fibroid  Subchorionic hemorrhage of placenta in first trimester    ED Discharge Orders     None          Lorelle Aleck JAYSON DEVONNA 04/11/24 2129    Bernard Drivers, MD 04/15/24 1423  "

## 2024-04-13 ENCOUNTER — Encounter (HOSPITAL_COMMUNITY): Payer: Self-pay | Admitting: *Deleted

## 2024-04-13 ENCOUNTER — Other Ambulatory Visit: Payer: Self-pay

## 2024-04-13 ENCOUNTER — Inpatient Hospital Stay (HOSPITAL_COMMUNITY)
Admission: AD | Admit: 2024-04-13 | Discharge: 2024-04-13 | Disposition: A | Discharge status: Home / Self Care | Attending: Obstetrics & Gynecology | Admitting: Obstetrics & Gynecology

## 2024-04-13 DIAGNOSIS — Z3201 Encounter for pregnancy test, result positive: Secondary | ICD-10-CM | POA: Diagnosis not present

## 2024-04-13 DIAGNOSIS — Z3490 Encounter for supervision of normal pregnancy, unspecified, unspecified trimester: Secondary | ICD-10-CM

## 2024-04-13 DIAGNOSIS — Z3A01 Less than 8 weeks gestation of pregnancy: Secondary | ICD-10-CM | POA: Diagnosis not present

## 2024-04-13 DIAGNOSIS — Z3491 Encounter for supervision of normal pregnancy, unspecified, first trimester: Secondary | ICD-10-CM

## 2024-04-13 DIAGNOSIS — Z3200 Encounter for pregnancy test, result unknown: Secondary | ICD-10-CM | POA: Diagnosis present

## 2024-04-13 LAB — CBC
HCT: 31.5 % — ABNORMAL LOW (ref 36.0–46.0)
Hemoglobin: 10.5 g/dL — ABNORMAL LOW (ref 12.0–15.0)
MCH: 30.2 pg (ref 26.0–34.0)
MCHC: 33.3 g/dL (ref 30.0–36.0)
MCV: 90.5 fL (ref 80.0–100.0)
Platelets: 339 K/uL (ref 150–400)
RBC: 3.48 MIL/uL — ABNORMAL LOW (ref 3.87–5.11)
RDW: 15.2 % (ref 11.5–15.5)
WBC: 7.4 K/uL (ref 4.0–10.5)
nRBC: 0 % (ref 0.0–0.2)

## 2024-04-13 LAB — HCG, QUANTITATIVE, PREGNANCY: hCG, Beta Chain, Quant, S: 10718 m[IU]/mL — ABNORMAL HIGH

## 2024-04-13 NOTE — MAU Provider Note (Signed)
 " History     [id number]  Arrival date and time: 04/13/24 1749    Chief Complaint  Patient presents with   HCG Level     HPI Natasha Hanna is a 18 y.o. at [redacted]w[redacted]d who presents for HCG follow up. Was seen in the ED two days ago and had an HCG of 7,061 and US  which showed IUP with early gestational sac and yolk sac with no pole or cardiac activity. Moderate subchorionic hematoma noted encompassing greater than 50% of gestational sac. She has minimal vaginal bleeding at this time and no other complaints. Denies dizziness, lightheadedness, near syncope.     --/--/O POS Performed at [facility], [address], [place], [place] [address]  [phone])  Past Medical History:  Diagnosis Date   Angio-edema    Asthma    Urticaria     Past Surgical History:  Procedure Laterality Date   ORTHOPEDIC SURGERY      Family History  Problem Relation Age of Onset   Healthy Mother    Hypertension Father     Social History   Socioeconomic History   Marital status: Single    Spouse name: Not on file   Number of children: Not on file   Years of education: Not on file   Highest education level: Not on file  Occupational History   Not on file  Tobacco Use   Smoking status: Never    Passive exposure: Current   Smokeless tobacco: Never  Vaping Use   Vaping status: Never Used  Substance and Sexual Activity   Alcohol use: No   Drug use: No   Sexual activity: Not on file  Other Topics Concern   Not on file  Social History Narrative   Not on file   Social Drivers of Health   Tobacco Use: Medium Risk (04/11/2024)   Patient History    Smoking Tobacco Use: Never    Smokeless Tobacco Use: Never    Passive Exposure: Current  Financial Resource Strain: Low Risk (02/15/2024)   Overall Financial Resource Strain (CARDIA)    Difficulty of Paying Living Expenses: Not very hard  Food Insecurity: Not on file  Transportation Needs: Not on file  Physical Activity:  Sufficiently Active (02/15/2024)   Exercise Vital Sign    Days of Exercise per Week: 7 days    Minutes of Exercise per Session: 30 min  Stress: No Stress Concern Present (02/15/2024)   [employer] of Occupational Health - Occupational Stress Questionnaire    Feeling of Stress: Only a little  Social Connections: Moderately Isolated (02/15/2024)   Social Connection and Isolation Panel    Frequency of Communication with Friends and Family: More than three times a week    Frequency of Social Gatherings with Friends and Family: More than three times a week    Attends Religious Services: More than 4 times per year    Active Member of [employer] or Organizations: No    Attends Banker Meetings: Never    Marital Status: Never married  Intimate Partner Violence: Not on file  Depression (EYV7-0): Not on file  Alcohol Screen: Low Risk (02/15/2024)   Alcohol Screen    Last Alcohol Screening Score (AUDIT): 1  Housing: Not on file  Utilities: Not on file  Health Literacy: Adequate Health Literacy (02/15/2024)   B1300 Health Literacy    Frequency of need for help with medical instructions: Never    Allergies[1]  Medications Ordered Prior to Encounter[2]  Pertinent positives and negative per HPI, all others reviewed and negative  Physical Exam   BP 115/67 (BP Location: Right Arm)   Pulse 86   Temp 98.3 F (36.8 C) (Oral)   Resp 20   Ht 5' 7 (1.702 m)   Wt 70.4 kg   LMP 03/06/2024 (Exact Date)   SpO2 100%   BMI 24.31 kg/m   Patient Vitals for the past 24 hrs:  BP Temp Temp src Pulse Resp SpO2 Height Weight  04/13/24 1801 115/67 98.3 F (36.8 C) Oral 86 20 100 % -- --  04/13/24 1755 -- -- -- -- -- -- 5' 7 (1.702 m) 70.4 kg    Physical Exam Vitals and nursing note reviewed.  Constitutional:      Appearance: She is well-developed.  HENT:     Head: Normocephalic and atraumatic.     Mouth/Throat:     Mouth: Mucous membranes are moist.  Eyes:     Extraocular  Movements: Extraocular movements intact.  Cardiovascular:     Rate and Rhythm: Normal rate and regular rhythm.  Pulmonary:     Effort: Pulmonary effort is normal.  Abdominal:     Palpations: Abdomen is soft.     Tenderness: There is no abdominal tenderness.  Skin:    Capillary Refill: Capillary refill takes less than 2 seconds.  Neurological:     General: No focal deficit present.     Mental Status: She is alert.     Labs Results for orders placed or performed during the hospital encounter of 04/13/24 (from the past 24 hours)  CBC     Status: Abnormal   Collection Time: 04/13/24  6:06 PM  Result Value Ref Range   WBC 7.4 4.0 - 10.5 K/uL   RBC 3.48 (L) 3.87 - 5.11 MIL/uL   Hemoglobin 10.5 (L) 12.0 - 15.0 g/dL   HCT 68.4 (L) 63.9 - 53.9 %   MCV 90.5 80.0 - 100.0 fL   MCH 30.2 26.0 - 34.0 pg   MCHC 33.3 30.0 - 36.0 g/dL   RDW 84.7 88.4 - 84.4 %   Platelets 339 150 - 400 K/uL   nRBC 0.0 0.0 - 0.2 %  hCG, quantitative, pregnancy     Status: Abnormal   Collection Time: 04/13/24  6:06 PM  Result Value Ref Range   hCG, Beta Chain, Quant, S 10,718 (H) <5 mIU/mL    Imaging No results found.  MAU Course  Procedures  Lab Orders         CBC         hCG, quantitative, pregnancy    No orders of the defined types were placed in this encounter.  Imaging Orders  No imaging studies ordered today    MDM Moderate (Level 3-4)  Assessment and Plan  Intrauterine pregnancy  [redacted] weeks gestation of pregnancy   Natasha Hanna is a 18 y.o. at [redacted]w[redacted]d who presents for HCG follow up.  HCG 12/23 7061 12/25 10718  -Appropriate rise in HCG -Mild drop in Hgb from previous two days ago but patient states that she is not currently bleeding and has only had minimal spotting currently. No signs and symptoms of acute blood loss.  -Discussed with patient with moderate subchorionic hematoma, can be at increased risk of miscarriage. -Advised patient to follow up in 14 days for viability US   unless vaginal bleeding gets worse -Bleeding precautions provided. -Stable for discharge home      Quaniyah Bugh L Shalin Vonbargen, MD/MHA 04/13/2024 7:59 PM  Allergies as of 04/13/2024       Reactions   Septra  [sulfamethoxazole -trimethoprim ] Anaphylaxis        Medication List     STOP taking these medications    Aurovela FE 1/20 1-20 MG-MCG tablet Generic drug: norethindrone-ethinyl estradiol-FE   azelastine  0.1 % nasal spray Commonly known as: ASTELIN    cephALEXin  500 MG capsule Commonly known as: KEFLEX    cetirizine  10 MG tablet Commonly known as: ZyrTEC  Allergy    ibuprofen  600 MG tablet Commonly known as: ADVIL    mometasone 50 MCG/ACT nasal spray Commonly known as: NASONEX       TAKE these medications    EPINEPHrine  0.3 mg/0.3 mL Soaj injection Commonly known as: EPI-PEN SMARTSIG:1 pre-filled pen syringe IM Once   fluticasone  50 MCG/ACT nasal spray Commonly known as: FLONASE  Place 2 sprays into both nostrils daily.           [1]  Allergies Allergen Reactions   Septra  [Sulfamethoxazole -Trimethoprim ] Anaphylaxis  [2]  No current facility-administered medications on file prior to encounter.   Current Outpatient Medications on File Prior to Encounter  Medication Sig Dispense Refill   AUROVELA FE 1/20 1-20 MG-MCG tablet Take 1 tablet by mouth daily.     azelastine  (ASTELIN ) 0.1 % nasal spray Place 2 sprays into both nostrils 2 (two) times daily as needed. Use in each nostril as directed 30 mL 5   cephALEXin  (KEFLEX ) 500 MG capsule Take 1 capsule (500 mg total) by mouth 4 (four) times daily. (Patient not taking: Reported on 03/22/2024) 20 capsule 0   cetirizine  (ZYRTEC  ALLERGY ) 10 MG tablet Take 1 tablet (10 mg total) by mouth daily. 30 tablet 5   EPINEPHrine  0.3 mg/0.3 mL IJ SOAJ injection SMARTSIG:1 pre-filled pen syringe IM Once     fluticasone  (FLONASE ) 50 MCG/ACT nasal spray Place 2 sprays into both nostrils daily. 16 g 5   ibuprofen  (ADVIL ) 600 MG  tablet Take 1 tablet (600 mg total) by mouth every 8 (eight) hours as needed for moderate pain. 21 tablet 0   mometasone (NASONEX) 50 MCG/ACT nasal spray Place 2 sprays into the nose. (Patient not taking: Reported on 03/22/2024)     "

## 2024-04-13 NOTE — MAU Note (Signed)
 Natasha Hanna is a 18 y.o. at Unknown here in MAU reporting: she's here fro HCG level.  Denies pain, but continues vto have spotting.  LMP: 03/06/2024 Onset of complaint: ongoing Pain score: 0 Vitals:   04/13/24 1801  BP: 115/67  Pulse: 86  Resp: 20  Temp: 98.3 F (36.8 C)  SpO2: 100%     FHT: NA  Lab orders placed from triage: HCG Level

## 2024-04-17 NOTE — Progress Notes (Signed)
 [facility]  CHIEF COMPLAINT: Chief Complaint  Patient presents with   Hospital Follow-Up    [facility]    HPI: Natasha Hanna presents today for follow-up from subchorionic hemorrhage diagnosed at an outside hospital.  Patient now has dark red blood.  No fresh blood.  No cramping.  Ultrasound today reveals a viable 6-week 0-day pregnancy.  There is a 2 cm area of subchorionic hemorrhage noted.  Positive fetal heart tones.  PLAN/DISCUSSED: 1) we went over subchorionic hemorrhage in great detail.  Precautions were given regarding follow-up.  Patient has new OB visit on the 20th and this is perfect for next ultrasound.  Precautions were given to follow-up if bleeding turns to bright red blood or cramping develops along with this.  All questions answered.  ALLERGIES: Allergies[1]  MEDICATIONS: Medications Ordered Prior to Encounter[2]  PAST MEDICAL HISTORY: Medical History[3]  PAST SURGICAL HISTORY: Surgical History[4]  OBSTETRIC HISTORY:  G1P0  GYNECOLOGIC HISTORY:     FAMILY HISTORY:  family history includes Diabetes in her maternal grandfather, maternal grandmother, paternal grandfather, and paternal grandmother; Hypertension in her maternal grandfather, maternal grandmother, paternal grandfather, and paternal grandmother.  SOCIAL HISTORY: Social History   Socioeconomic History   Marital status: Single    Spouse name: Not on file   Number of children: Not on file   Years of education: Not on file   Highest education level: Not on file  Occupational History   Not on file  Tobacco Use   Smoking status: Never   Smokeless tobacco: Never  Vaping Use   Vaping status: Never Used  Substance and Sexual Activity   Alcohol use: Not Currently   Drug use: Never   Sexual activity: Not on file  Other Topics Concern   Not on file  Social History Narrative   Not on file   Social Drivers of Health   Food Insecurity: Low Risk (04/16/2024)   Food vital sign     Within the past 12 months, you worried that your food would run out before you got money to buy more: Never true    Within the past 12 months, the food you bought just didn't last and you didn't have money to get more: Never true  Transportation Needs: No Transportation Needs (04/16/2024)   Transportation    In the past 12 months, has lack of reliable transportation kept you from medical appointments, meetings, work or from getting things needed for daily living? : No  Living Situation: Low Risk (04/16/2024)   Living Situation    What is your living situation today?: I have a steady place to live    Think about the place you live. Do you have problems with any of the following? Choose all that apply:: None/None on this list  Utilities: Low Risk (04/16/2024)   Utilities    In the past 12 months has the electric, gas, oil, or water company threatened to shut off services in your home? : No  Safety: Not on file  Depression: Not At Risk (04/04/2024)   PHQ-2    PHQ-2 Score: 0  Tobacco Use: Low Risk (04/17/2024)   Patient History    Smoking Tobacco Use: Never    Smokeless Tobacco Use: Never    Passive Exposure: Not on file  Recent Concern: Tobacco Use - Medium Risk (04/11/2024)   Received from [facility]   Patient History    Smoking Tobacco Use: Never    Smokeless Tobacco Use: Never    Passive Exposure: Current  Alcohol Screening: Not At Risk (02/05/2024)   Alcohol    Audit C Alcohol risk score: 0    REVIEW OF SYSTEMS:  GENERAL: no fevers, chills, significant changes in weight  HEENT: no changes in vision or hearing, no rhinorrhea or cough BREAST: no pain, masses or discharge RESPIRATORY: no cough, increased work of breathing or shortness of breath CARDIOVASCULAR: no chest pain or palpitations GASTROINTESTINAL: no N/V/D, no abdominal or pelvic pain, regular bowel movements GENITOURINARY: no dysuria, no vaginal discharge, no bowel or bladder  incontinence NEUROLOGICAL: no headaches, memory loss or weakness MUSCULOSKELETAL: no bone or joint pain SKIN: no rashes or lesions PSYCHIATRY: no changes in mood, no anxiety or depression.  AUTOIMMUNE/HEMEONC: No problems reported  PHYSICAL EXAM: BP 110/62   Wt 69.3 kg (152 lb 12.8 oz)   LMP 03/06/2024 (Exact Date)   BMI 23.93 kg/m  Body mass index is 23.93 kg/m.  GENERAL: Well appearing female.  Appears her stated age.  Alert + oriented.  In NAD.   A chaperone was present for the exam portion of the visit    Electronically signed by: Ozell Dallas Milks, MD 04/17/2024 12:21 PM         [1] Allergies Allergen Reactions   Sulfamethoxazole -Trimethoprim  Anaphylaxis   Sulfa  (Sulfonamide Antibiotics)   [2] Current Outpatient Medications on File Prior to Visit  Medication Sig Dispense Refill   EPINEPHrine  (EPIPEN ) 0.3 mg/0.3 mL injection syringe Inject into the thigh once.     mometasone (NASONEX) 50 mcg/actuation spry spray Administer 2 sprays into affected nostril(s) Once Daily. 1 Inhaler 0   Aurovela Fe 1-20, 28, 1 mg-20 mcg (21)/75 mg (7) tab Take 1 tablet by mouth daily. (Patient not taking: Reported on 04/17/2024)     No current facility-administered medications on file prior to visit.  [3] Past Medical History: Diagnosis Date   Asthma (CMD)   [4] Past Surgical History: Procedure Laterality Date   OTHER SURGICAL HISTORY Left    Procedure: OTHER SURGICAL HISTORY (arm surgery)

## 2024-04-18 NOTE — Telephone Encounter (Signed)
Phenergan

## 2024-04-25 NOTE — Telephone Encounter (Signed)
 Left voicemail for patient to call office back to complete Nurse talk.Crystal Anette Downy, CMA

## 2024-04-26 ENCOUNTER — Inpatient Hospital Stay (HOSPITAL_COMMUNITY)
Admission: AD | Admit: 2024-04-26 | Discharge: 2024-04-26 | Disposition: A | Discharge status: Home / Self Care | Payer: Self-pay | Attending: Obstetrics & Gynecology | Admitting: Obstetrics & Gynecology

## 2024-04-26 ENCOUNTER — Other Ambulatory Visit: Payer: Self-pay

## 2024-04-26 DIAGNOSIS — R1030 Lower abdominal pain, unspecified: Secondary | ICD-10-CM | POA: Diagnosis present

## 2024-04-26 DIAGNOSIS — O26891 Other specified pregnancy related conditions, first trimester: Secondary | ICD-10-CM | POA: Diagnosis present

## 2024-04-26 DIAGNOSIS — Z3A01 Less than 8 weeks gestation of pregnancy: Secondary | ICD-10-CM | POA: Insufficient documentation

## 2024-04-26 DIAGNOSIS — K5900 Constipation, unspecified: Secondary | ICD-10-CM | POA: Diagnosis present

## 2024-04-26 DIAGNOSIS — R109 Unspecified abdominal pain: Secondary | ICD-10-CM | POA: Diagnosis not present

## 2024-04-26 NOTE — MAU Note (Signed)
 Natasha Hanna is a 19 y.o. at [redacted]w[redacted]d here in MAU reporting: around 0300 was having severe lower abdominal cramping - woke up out of her sleep and went to restroom, felt urge to poop - had BM and pain went away. Reports throughout the day she has been okay, but started having cramping again around 2030 on entire left side of abdomen. Denies VB or abnormal discharge. Reports having subchorionic hemorrhage and fibroid so she wanted to come get checked out.   Pain score: 6 Vitals:   04/26/24 2202  BP: 125/65  Pulse: 97  Resp: 18  Temp: 98.6 F (37 C)  SpO2: 100%     FHT: NA  Lab orders placed from triage: UA

## 2024-04-26 NOTE — MAU Provider Note (Signed)
 " History     [id number]  Arrival date and time: 04/26/24 2141    Chief Complaint  Patient presents with   Abdominal Pain     HPI Natasha Hanna is a 19 y.o. at 103w2d who presents for abdominal cramping that resolves after bowel movement.  She admits that she has not been drinking much fluids.  She endorses constipation.  She denies vaginal irritation/itching.  She came into the MAU because she was concerned about baby.  She was seen here on 04/13/2024 where IUP found on ultrasound with large subchorionic hematoma.  She has been receiving prenatal care at [facility] with next appointment on 05/09/2024.  --/--/O POS Performed at [facility], [address] [place], [place] [address]  [phone])  Past Medical History:  Diagnosis Date   Angio-edema    Asthma    Urticaria     Past Surgical History:  Procedure Laterality Date   ORTHOPEDIC SURGERY      Family History  Problem Relation Age of Onset   Healthy Mother    Hypertension Father     Social History   Socioeconomic History   Marital status: Significant Other    Spouse name: Not on file   Number of children: Not on file   Years of education: Not on file   Highest education level: Not on file  Occupational History   Not on file  Tobacco Use   Smoking status: Never    Passive exposure: Current   Smokeless tobacco: Never  Vaping Use   Vaping status: Never Used  Substance and Sexual Activity   Alcohol use: No   Drug use: No   Sexual activity: Not on file  Other Topics Concern   Not on file  Social History Narrative   Not on file   Social Drivers of Health   Tobacco Use: Low Risk (04/25/2024)   Received from [facility]   Patient History    Smoking Tobacco Use: Never    Smokeless Tobacco Use: Never    Passive Exposure: Not on file  Recent Concern: Tobacco Use - Medium Risk (04/11/2024)   Patient History    Smoking Tobacco Use: Never    Smokeless Tobacco Use: Never    Passive  Exposure: Current  Financial Resource Strain: Low Risk (02/15/2024)   Overall Financial Resource Strain (CARDIA)    Difficulty of Paying Living Expenses: Not very hard  Food Insecurity: Low Risk (04/16/2024)   Received from [facility]   Epic    Within the past 12 months, you worried that your food would run out before you got money to buy more: Never true    Within the past 12 months, the food you bought just didn't last and you didn't have money to get more. : Never true  Transportation Needs: No Transportation Needs (04/16/2024)   Received from [facility]    In the past 12 months, has lack of reliable transportation kept you from medical appointments, meetings, work or from getting things needed for daily living? : No  Physical Activity: Sufficiently Active (02/15/2024)   Exercise Vital Sign    Days of Exercise per Week: 7 days    Minutes of Exercise per Session: 30 min  Stress: No Stress Concern Present (02/15/2024)   [employer] of Occupational Health - Occupational Stress Questionnaire    Feeling of Stress: Only a little  Social Connections: Moderately Isolated (02/15/2024)   Social Connection and Isolation Panel  Frequency of Communication with Friends and Family: More than three times a week    Frequency of Social Gatherings with Friends and Family: More than three times a week    Attends Religious Services: More than 4 times per year    Active Member of Clubs or Organizations: No    Attends Banker Meetings: Never    Marital Status: Never married  Intimate Partner Violence: Not on file  Depression (PHQ2-9): Not on file  Alcohol Screen: Low Risk (02/15/2024)   Alcohol Screen    Last Alcohol Screening Score (AUDIT): 1  Housing: Low Risk (04/16/2024)   Received from [facility]   Epic    What is your living situation today?: I have a steady place to live    Think about the place you live. Do you have problems with any of the  following? Choose all that apply:: None/None on this list  Utilities: Low Risk (04/16/2024)   Received from [facility]   Utilities    In the past 12 months has the electric, gas, oil, or water company threatened to shut off services in your home? : No  Health Literacy: Adequate Health Literacy (02/15/2024)   B1300 Health Literacy    Frequency of need for help with medical instructions: Never    Allergies[1]  Medications Ordered Prior to Encounter[2]  Pertinent positives and negative per HPI, all others reviewed and negative  Physical Exam   BP 110/60 (BP Location: Left Arm)   Pulse 91   Temp 98.6 F (37 C) (Oral)   Resp 18   Ht 5' 7 (1.702 m)   Wt 71.8 kg   LMP 03/06/2024 (Exact Date)   SpO2 100%   BMI 24.78 kg/m   Patient Vitals for the past 24 hrs:  BP Temp Temp src Pulse Resp SpO2 Height Weight  04/26/24 2257 110/60 -- -- 91 -- -- -- --  04/26/24 2202 125/65 98.6 F (37 C) Oral 97 18 100 % 5' 7 (1.702 m) 71.8 kg    Physical Exam Vitals and nursing note reviewed.  Constitutional:      Appearance: She is well-developed.  HENT:     Head: Normocephalic and atraumatic.     Mouth/Throat:     Mouth: Mucous membranes are moist.  Eyes:     Extraocular Movements: Extraocular movements intact.  Cardiovascular:     Rate and Rhythm: Normal rate and regular rhythm.  Pulmonary:     Effort: Pulmonary effort is normal.  Abdominal:     Palpations: Abdomen is soft.     Tenderness: There is no abdominal tenderness.  Skin:    Capillary Refill: Capillary refill takes less than 2 seconds.  Neurological:     General: No focal deficit present.     Mental Status: She is alert.     Labs No results found for this or any previous visit (from the past 24 hours).  Imaging No results found.  MAU Course  Procedures Lab Orders  No laboratory test(s) ordered today   No orders of the defined types were placed in this encounter.  Imaging Orders  No imaging studies  ordered today    MDM Moderate (Level 3-4)  Assessment and Plan  Abdominal cramping  [redacted] weeks gestation of pregnancy  -Abdominal cramping that resolved with bowel movement. -Likely component of dehydration versus constipation versus vaginal infection - Bedside ultrasound showed fetal heart rate of 152 and crown-rump length measuring 6 weeks 6 days which is consistent with LMP.  No concern for miscarriage at this time.  - Stable to discharge home - All questions answered, anticipatory guidance, detailed return precautions provided. - Encourage patient to keep appointment at [facility] on 05/09/2024 for next [facility] appointment.  Jannelly Bergren L Shamra Bradeen, MD/MHA 04/26/2024 11:00 PM  Allergies as of 04/26/2024       Reactions   Septra  [sulfamethoxazole -trimethoprim ] Anaphylaxis        Medication List     TAKE these medications    EPINEPHrine  0.3 mg/0.3 mL Soaj injection Commonly known as: EPI-PEN SMARTSIG:1 pre-filled pen syringe IM Once   fluticasone  50 MCG/ACT nasal spray Commonly known as: FLONASE  Place 2 sprays into both nostrils daily.   promethazine 25 MG tablet Commonly known as: PHENERGAN Take 12.5 mg by mouth every 6 (six) hours as needed for nausea or vomiting.           [1]  Allergies Allergen Reactions   Septra  [Sulfamethoxazole -Trimethoprim ] Anaphylaxis  [2]  No current facility-administered medications on file prior to encounter.   Current Outpatient Medications on File Prior to Encounter  Medication Sig Dispense Refill   promethazine (PHENERGAN) 25 MG tablet Take 12.5 mg by mouth every 6 (six) hours as needed for nausea or vomiting.     EPINEPHrine  0.3 mg/0.3 mL IJ SOAJ injection SMARTSIG:1 pre-filled pen syringe IM Once     fluticasone  (FLONASE ) 50 MCG/ACT nasal spray Place 2 sprays into both nostrils daily. 16 g 5   "

## 2024-04-26 NOTE — MAU Note (Addendum)
 Pt unable to give urine sample at this time

## 2024-05-09 ENCOUNTER — Ambulatory Visit: Admitting: Internal Medicine

## 2024-08-15 ENCOUNTER — Ambulatory Visit: Admitting: Family Medicine
